# Patient Record
Sex: Female | Born: 1960 | Race: Black or African American | Hispanic: No | Marital: Single | Smoking: Current every day smoker
Health system: Southern US, Community
[De-identification: ages and names within clinical notes are randomized; demographics above are authoritative.]

## PROBLEM LIST (undated history)

## (undated) DIAGNOSIS — I10 Essential (primary) hypertension: Secondary | ICD-10-CM

## (undated) HISTORY — PX: REPLACEMENT TOTAL KNEE: SUR1224

---

## 2019-12-01 ENCOUNTER — Other Ambulatory Visit: Payer: Self-pay

## 2019-12-01 ENCOUNTER — Emergency Department (HOSPITAL_BASED_OUTPATIENT_CLINIC_OR_DEPARTMENT_OTHER): Payer: Worker's Compensation

## 2019-12-01 ENCOUNTER — Emergency Department (HOSPITAL_BASED_OUTPATIENT_CLINIC_OR_DEPARTMENT_OTHER)
Admission: EM | Admit: 2019-12-01 | Discharge: 2019-12-01 | Disposition: A | Discharge status: Home / Self Care | Payer: Worker's Compensation | Attending: Emergency Medicine | Admitting: Emergency Medicine

## 2019-12-01 ENCOUNTER — Encounter (HOSPITAL_BASED_OUTPATIENT_CLINIC_OR_DEPARTMENT_OTHER): Payer: Self-pay | Admitting: *Deleted

## 2019-12-01 DIAGNOSIS — Y990 Civilian activity done for income or pay: Secondary | ICD-10-CM | POA: Insufficient documentation

## 2019-12-01 DIAGNOSIS — F17210 Nicotine dependence, cigarettes, uncomplicated: Secondary | ICD-10-CM | POA: Insufficient documentation

## 2019-12-01 DIAGNOSIS — I10 Essential (primary) hypertension: Secondary | ICD-10-CM | POA: Diagnosis not present

## 2019-12-01 DIAGNOSIS — Z96652 Presence of left artificial knee joint: Secondary | ICD-10-CM | POA: Diagnosis not present

## 2019-12-01 DIAGNOSIS — Z23 Encounter for immunization: Secondary | ICD-10-CM | POA: Diagnosis not present

## 2019-12-01 DIAGNOSIS — S60450A Superficial foreign body of right index finger, initial encounter: Secondary | ICD-10-CM | POA: Diagnosis not present

## 2019-12-01 DIAGNOSIS — S60459A Superficial foreign body of unspecified finger, initial encounter: Secondary | ICD-10-CM

## 2019-12-01 DIAGNOSIS — S6991XA Unspecified injury of right wrist, hand and finger(s), initial encounter: Secondary | ICD-10-CM | POA: Diagnosis present

## 2019-12-01 DIAGNOSIS — W458XXA Other foreign body or object entering through skin, initial encounter: Secondary | ICD-10-CM | POA: Diagnosis not present

## 2019-12-01 HISTORY — DX: Essential (primary) hypertension: I10

## 2019-12-01 MED ORDER — TETANUS-DIPHTH-ACELL PERTUSSIS 5-2.5-18.5 LF-MCG/0.5 IM SUSY
0.5000 mL | PREFILLED_SYRINGE | Freq: Once | INTRAMUSCULAR | Status: AC
  Administered 2019-12-01: 0.5 mL via INTRAMUSCULAR
  Filled 2019-12-01: qty 0.5

## 2019-12-01 MED ORDER — LIDOCAINE HCL (PF) 1 % IJ SOLN
2.0000 mL | Freq: Once | INTRAMUSCULAR | Status: AC
  Administered 2019-12-01: 2 mL via INTRADERMAL
  Filled 2019-12-01: qty 5

## 2019-12-01 MED ORDER — BACITRACIN ZINC 500 UNIT/GM EX OINT
TOPICAL_OINTMENT | Freq: Two times a day (BID) | CUTANEOUS | Status: DC
  Administered 2019-12-01: 1 via TOPICAL
  Filled 2019-12-01: qty 28.35

## 2019-12-01 NOTE — ED Triage Notes (Signed)
Pt reports left finger injury at work.  Reports that she went to [facility] and they were unable to determine what the 'hard piece sticking out' was. Pt reports she had an xray that was normal.

## 2019-12-01 NOTE — ED Provider Notes (Signed)
[facility] EMERGENCY DEPARTMENT Provider Note   CSN: [id number] Arrival date & time: 12/01/19  1813     History Chief Complaint  Patient presents with  . Finger Injury    Pamela Rhodes is a 59 y.o. female.  The history is provided by the patient.   Pamela Rhodes is a 59 y.o. female who presents to the Emergency Department complaining of finger injury. 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 The sander was off but still rotating and it touched the dorsal surface of her left second digit. She experienced bleeding. She was evaluated urgent care where an object was found sticking out of her finger. They attempted to remove the object but could not and referred her to the emergency department. She is right-hand dominant. She complains of pain to the local area. She has a history of high blood pressure, no additional medical problems. She is not diabetic. Unsure when her last tetanus shot was    Past Medical History:  Diagnosis Date  . Hypertension     There are no problems to display for this patient.   Past Surgical History:  Procedure Laterality Date  . REPLACEMENT TOTAL KNEE Left      OB History   No obstetric history on file.     History reviewed. No pertinent family history.  Social History   Tobacco Use  . Smoking status: Current Every Day Smoker    Packs/day: 0.50    Types: Cigarettes  . Smokeless tobacco: Never Used  Substance Use Topics  . Alcohol use: Not Currently  . Drug use: Not Currently    Home Medications Prior to Admission medications   Not on File    Allergies    Patient has no known allergies.  Review of Systems   Review of Systems  All other systems reviewed and are negative.   Physical Exam Updated Vital Signs BP (!) 145/100   Pulse 62   Temp 98.7 F (37.1 C) (Oral)   Resp 18   Ht 5\' 2"  (1.575 m)   Wt 91.6 kg   SpO2 98%   BMI 36.95 kg/m   Physical Exam Vitals and nursing note reviewed.    Constitutional:      Appearance: Normal appearance.  HENT:     Head: Normocephalic and atraumatic.  Cardiovascular:     Rate and Rhythm: Normal rate and regular rhythm.  Pulmonary:     Effort: Pulmonary effort is normal. No respiratory distress.  Musculoskeletal:     Comments: The left dorsal second digit has 1/2 cm wound with a firm narrower object protruding from the wound that is approximately half centimeters long. Flexion extension is intact the digit against resistance  Skin:    General: Skin is warm and dry.  Neurological:     Mental Status: She is alert and oriented to person, place, and time.     ED Results / Procedures / Treatments   Labs (all labs ordered are listed, but only abnormal results are displayed) Labs Reviewed - No data to display  EKG None  Radiology DG Finger Index Left  Result Date: 12/01/2019 CLINICAL DATA:  Pain following injury EXAM: LEFT SECOND FINGER 2+V COMPARISON:  None. FINDINGS: Frontal, oblique, and lateral views were obtained. There is no appreciable fracture or dislocation. 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 No erosive changes. No evidence soft  tissue air or radiopaque foreign body. IMPRESSION: Areas of osteoarthritic change. No fracture or dislocation. No soft tissue air or radiopaque foreign body. Electronically Signed   By: Bretta Bang III M.D.   On: 12/01/2019 18:55    Procedures .Foreign Body Removal  Date/Time: 12/01/2019 11:21 PM Performed by: Tilden Fossa, MD Authorized by: Tilden Fossa, MD  Body area: skin General location: upper extremity Location details: right index finger Anesthesia: digital block and local infiltration  Anesthesia: Local Anesthetic: lidocaine 1% without epinephrine Anesthetic total: 2 mL Removal mechanism: forceps and irrigation Dressing: antibiotic ointment Tendon involvement: none Depth: subcutaneous 1  objects recovered. Objects recovered: 1 cm long linear piece of debris Post-procedure assessment: foreign body removed Patient tolerance: patient tolerated the procedure well with no immediate complications   (including critical care time)  Medications Ordered in ED Medications  bacitracin ointment (has no administration in time range)  Tdap (BOOSTRIX) injection 0.5 mL (0.5 mLs Intramuscular Given 12/01/19 2216)  lidocaine (PF) (XYLOCAINE) 1 % injection 2 mL (2 mLs Intradermal Given 12/01/19 2231)    ED Course  I have reviewed the triage vital signs and the nursing notes.  Pertinent labs & imaging results that were available during my care of the patient were reviewed by me and considered in my medical decision making (see chart for details).    MDM Rules/Calculators/A&P                         patient here for evaluation of finger injury. She does have a foreign body on examination. This was removed after digital block and local analgesia. No evidence of tendon injury. The wound was irrigated after removal of foreign body. Tissue defect is approximately half a centimeter, will not close. Discussed with patient local wound care. Discussed outpatient follow-up and return precautions.  Final Clinical Impression(s) / ED Diagnoses Final diagnoses:  Foreign body in skin of finger, initial encounter    Rx / DC Orders ED Discharge Orders    None       Tilden Fossa, MD 12/01/19 2324

## 2021-06-22 IMAGING — DX DG FINGER INDEX 2+V*L*
3 series · 3 of 3 positions shown · non-contrast
Comparison: None.

CLINICAL DATA: Pain following injury

EXAM:
LEFT SECOND FINGER 2+V

[finger ap]
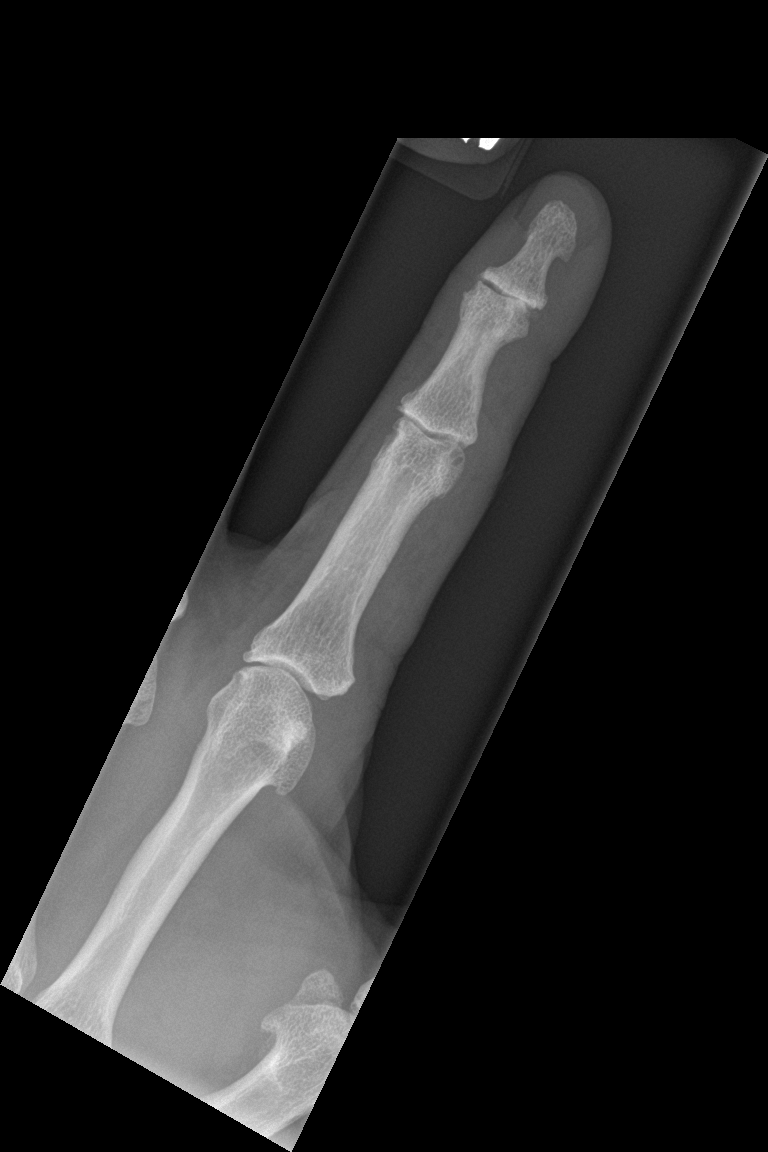

[finger obl]
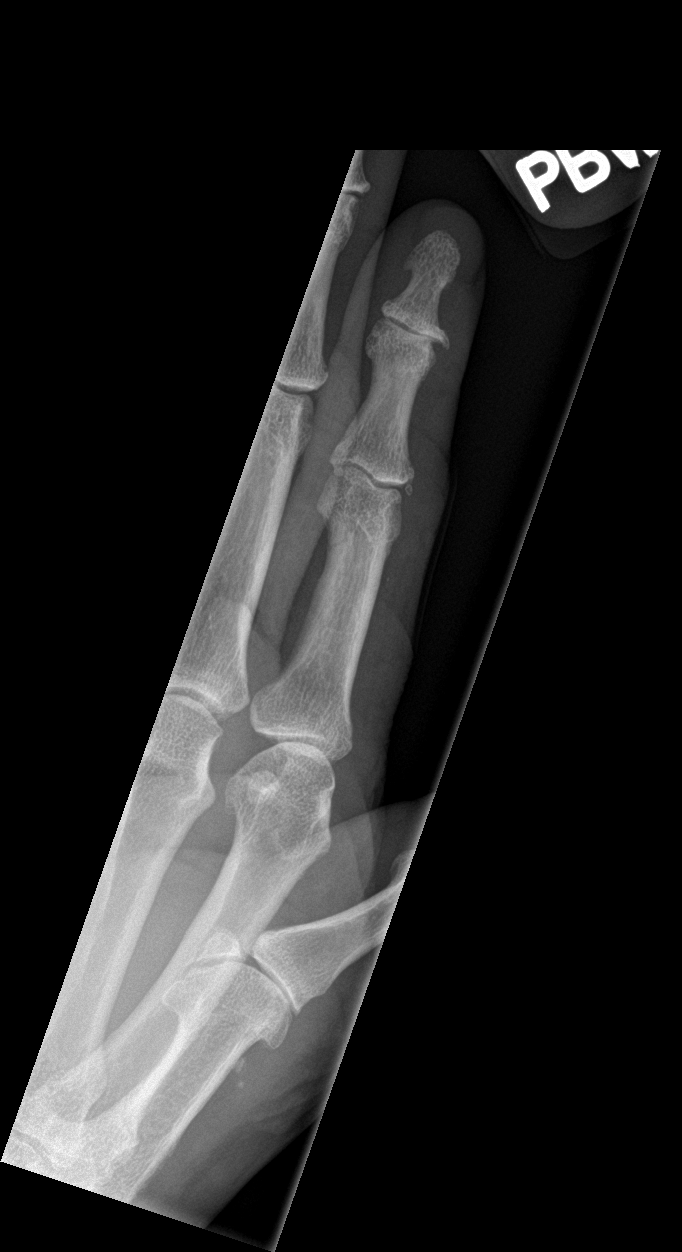

[finger lat]
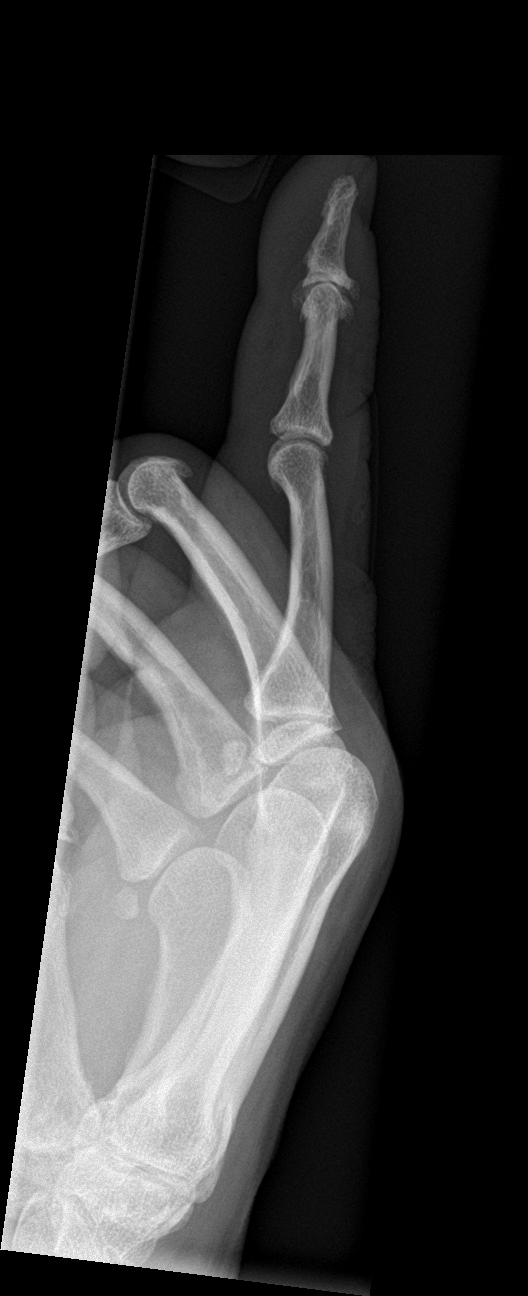

[3 of 3 positions shown; findings below may reference images not displayed]

FINDINGS: Frontal, oblique, and lateral views were obtained. There is no
appreciable fracture or dislocation. There is osteoarthritic change
in the first DIP joint as well as to a lesser extent in the first
MCP and PIP joints. No erosive changes. No evidence soft tissue air
or radiopaque foreign body.
IMPRESSION: Areas of osteoarthritic change. No fracture or dislocation. No soft
tissue air or radiopaque foreign body.

## 2022-02-13 ENCOUNTER — Emergency Department (HOSPITAL_BASED_OUTPATIENT_CLINIC_OR_DEPARTMENT_OTHER): Payer: BC Managed Care – PPO

## 2022-02-13 ENCOUNTER — Other Ambulatory Visit: Payer: Self-pay

## 2022-02-13 ENCOUNTER — Emergency Department (HOSPITAL_BASED_OUTPATIENT_CLINIC_OR_DEPARTMENT_OTHER)
Admission: EM | Admit: 2022-02-13 | Discharge: 2022-02-14 | Disposition: A | Discharge status: Home / Self Care | Payer: BC Managed Care – PPO | Attending: Emergency Medicine | Admitting: Emergency Medicine

## 2022-02-13 ENCOUNTER — Encounter (HOSPITAL_BASED_OUTPATIENT_CLINIC_OR_DEPARTMENT_OTHER): Payer: Self-pay | Admitting: Emergency Medicine

## 2022-02-13 DIAGNOSIS — M79605 Pain in left leg: Secondary | ICD-10-CM | POA: Diagnosis not present

## 2022-02-13 DIAGNOSIS — M25552 Pain in left hip: Secondary | ICD-10-CM | POA: Diagnosis not present

## 2022-02-13 MED ORDER — CYCLOBENZAPRINE HCL 10 MG PO TABS: 10.0000 mg | ORAL_TABLET | Freq: Two times a day (BID) | ORAL | 0 refills | Status: AC | PRN

## 2022-02-13 MED ORDER — CYCLOBENZAPRINE HCL 5 MG PO TABS
5.0000 mg | ORAL_TABLET | Freq: Once | ORAL | Status: AC
  Administered 2022-02-13: 5 mg via ORAL
  Filled 2022-02-13: qty 1

## 2022-02-13 MED ORDER — OXYCODONE-ACETAMINOPHEN 5-325 MG PO TABS
1.0000 | ORAL_TABLET | Freq: Once | ORAL | Status: AC
  Administered 2022-02-13: 1 via ORAL
  Filled 2022-02-13: qty 1

## 2022-02-13 NOTE — ED Triage Notes (Addendum)
Patient c/o right thigh pain today while she was working, states her leg gave out on her today while she was at work and her co-workers had to assist her to her car. Patient states it feels like a pulled muscle and the pain is worse when she is walking.

## 2022-02-13 NOTE — ED Provider Notes (Signed)
[facility] Provider Note   CSN: [id number] Arrival date & time: 02/13/22  2002     History  Chief Complaint  Patient presents with   Leg Pain    Pamela Rhodes is a 62 y.o. female.  The history is provided by the patient.  Leg Pain Pamela Rhodes is a 61 y.o. female who presents to the Emergency Department complaining of leg pain.  She presents to the emergency department for evaluation of left leg pain that started around 5 AM when she was leaving work.  She states that she developed a cramp and spasm in her left groin and her leg went out.  She needed assistance to get to her car and it took about 30 minutes for the pain to subside.  She has experienced occasional groin pain when working but this is significantly worse.  Pain is described as a throbbing pain.  Overall it is improved since her event this morning. Left leg went out 5am leaving work Had occ groin pain when working.   No fever, vomiting, dysuria, incontinence, numbness.  She has experienced occasional epigastric pain for the last month.  She is scheduled to have her right hip replaced in a few weeks.       Home Medications Prior to Admission medications   Medication Sig Start Date End Date Taking? Authorizing Provider  cyclobenzaprine (FLEXERIL) 10 MG tablet Take 1 tablet (10 mg total) by mouth 2 (two) times daily as needed for muscle spasms. 02/13/22  Yes Quintella Reichert, MD      Allergies    Patient has no known allergies.    Review of Systems   Review of Systems  All other systems reviewed and are negative.   Physical Exam Updated Vital Signs BP 131/72 (BP Location: Right Arm)   Pulse (!) 54   Temp 97.7 F (36.5 C) (Oral)   Resp 18   Ht 5\' 2"  (1.575 m)   Wt 92.5 kg   SpO2 100%   BMI 37.31 kg/m  Physical Exam Vitals and nursing note reviewed.  Constitutional:      Appearance: She is well-developed.  HENT:     Head: Normocephalic and atraumatic.   Cardiovascular:     Rate and Rhythm: Normal rate and regular rhythm.     Heart sounds: No murmur heard. Pulmonary:     Effort: Pulmonary effort is normal. No respiratory distress.     Breath sounds: Normal breath sounds.  Abdominal:     Palpations: Abdomen is soft.     Tenderness: There is no abdominal tenderness. There is no guarding or rebound.  Musculoskeletal:        General: No swelling.     Comments: 2+ DP pulses bilaterally.  There is tenderness to palpation over the left medial thigh without any palpable masses.  She is able to range the hip and knee but does have pain on range of motion at the hip and knee referred to the thigh.  Skin:    General: Skin is warm and dry.  Neurological:     Mental Status: She is alert and oriented to person, place, and time.     Comments: 5 out of 5 strength in bilateral lower extremities with sensation to light touch intact in bilateral lower extremities  Psychiatric:        Behavior: Behavior normal.     ED Results / Procedures / Treatments   Labs (all labs ordered are listed, but only  abnormal results are displayed) Labs Reviewed - No data to display  EKG None  Radiology DG HIP UNILAT WITH PELVIS 2-3 VIEWS LEFT  Result Date: 02/13/2022 CLINICAL DATA:  Pain EXAM: DG HIP (WITH OR WITHOUT PELVIS) 2-3V LEFT COMPARISON:  Pelvis CT 12/22/2021 FINDINGS: No definite acute fracture or dislocation identified. There are mild-to-moderate degenerative changes of the left hip similar to the prior study. 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 There also mild degenerative changes of the pubic symphysis and lower lumbar spine. IMPRESSION: 1. No definite acute fracture or dislocation. 2. 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 3. Mild to moderate degenerative changes of the left hip. Electronically Signed   By: Ronney Asters M.D.   On: 02/13/2022 21:39     Procedures Procedures    Medications Ordered in ED Medications  cyclobenzaprine (FLEXERIL) tablet 5 mg (5 mg Oral Given 02/13/22 2358)  oxyCODONE-acetaminophen (PERCOCET/ROXICET) 5-325 MG per tablet 1 tablet (1 tablet Oral Given 02/13/22 2358)    ED Course/ Medical Decision Making/ A&P                             Medical Decision Making Risk Prescription drug management.   Patient with history of hypertension here for evaluation of left groin pain.  She is neurologically and vascularly intact on evaluation.  She does have tenderness over the left groin without any palpable deformities images are negative for acute fracture or dislocation, demonstrates degenerative changes.  Images personally reviewed and interpreted, agree with radiologist interpretation.  Current clinical picture is not consistent with septic or gouty arthritis, CVA, dissection.  Discussed with patient home care for groin pain/hip pain.  She was just prescribed an anti-inflammatory by her orthopedist and has not filled this yet-recommend that she initiate this.  Will prescribe muscle relaxer for her to use as needed.  She is scheduled to follow-up with PCP tomorrow.  Discussed continuing PCP as well as orthopedics follow-up.        Final Clinical Impression(s) / ED Diagnoses Final diagnoses:  Left hip pain    Rx / DC Orders ED Discharge Orders          Ordered    cyclobenzaprine (FLEXERIL) 10 MG tablet  2 times daily PRN        02/13/22 2342              Quintella Reichert, MD 02/14/22 0003
